# Patient Record
Sex: Male | Born: 1985
Health system: Southern US, Community
[De-identification: ages and names within clinical notes are randomized; demographics above are authoritative.]

## PROBLEM LIST (undated history)

## (undated) HISTORY — PX: NO PAST SURGERIES: SHX2092

---
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
## 2006-05-15 ENCOUNTER — Ambulatory Visit: Payer: Self-pay | Admitting: Pediatrics

## 2009-04-14 IMAGING — CR DG CHEST 2V
1 series · 2 of 2 positions shown · non-contrast
Comparison: NONE

CLINICAL DATA: Follow up right lung infiltrates. 

TWO VIEW CHEST X-RAY

[Series 1: view not recorded · 0.17mm/px · 2 of 2 slices shown]
[im 1/2]
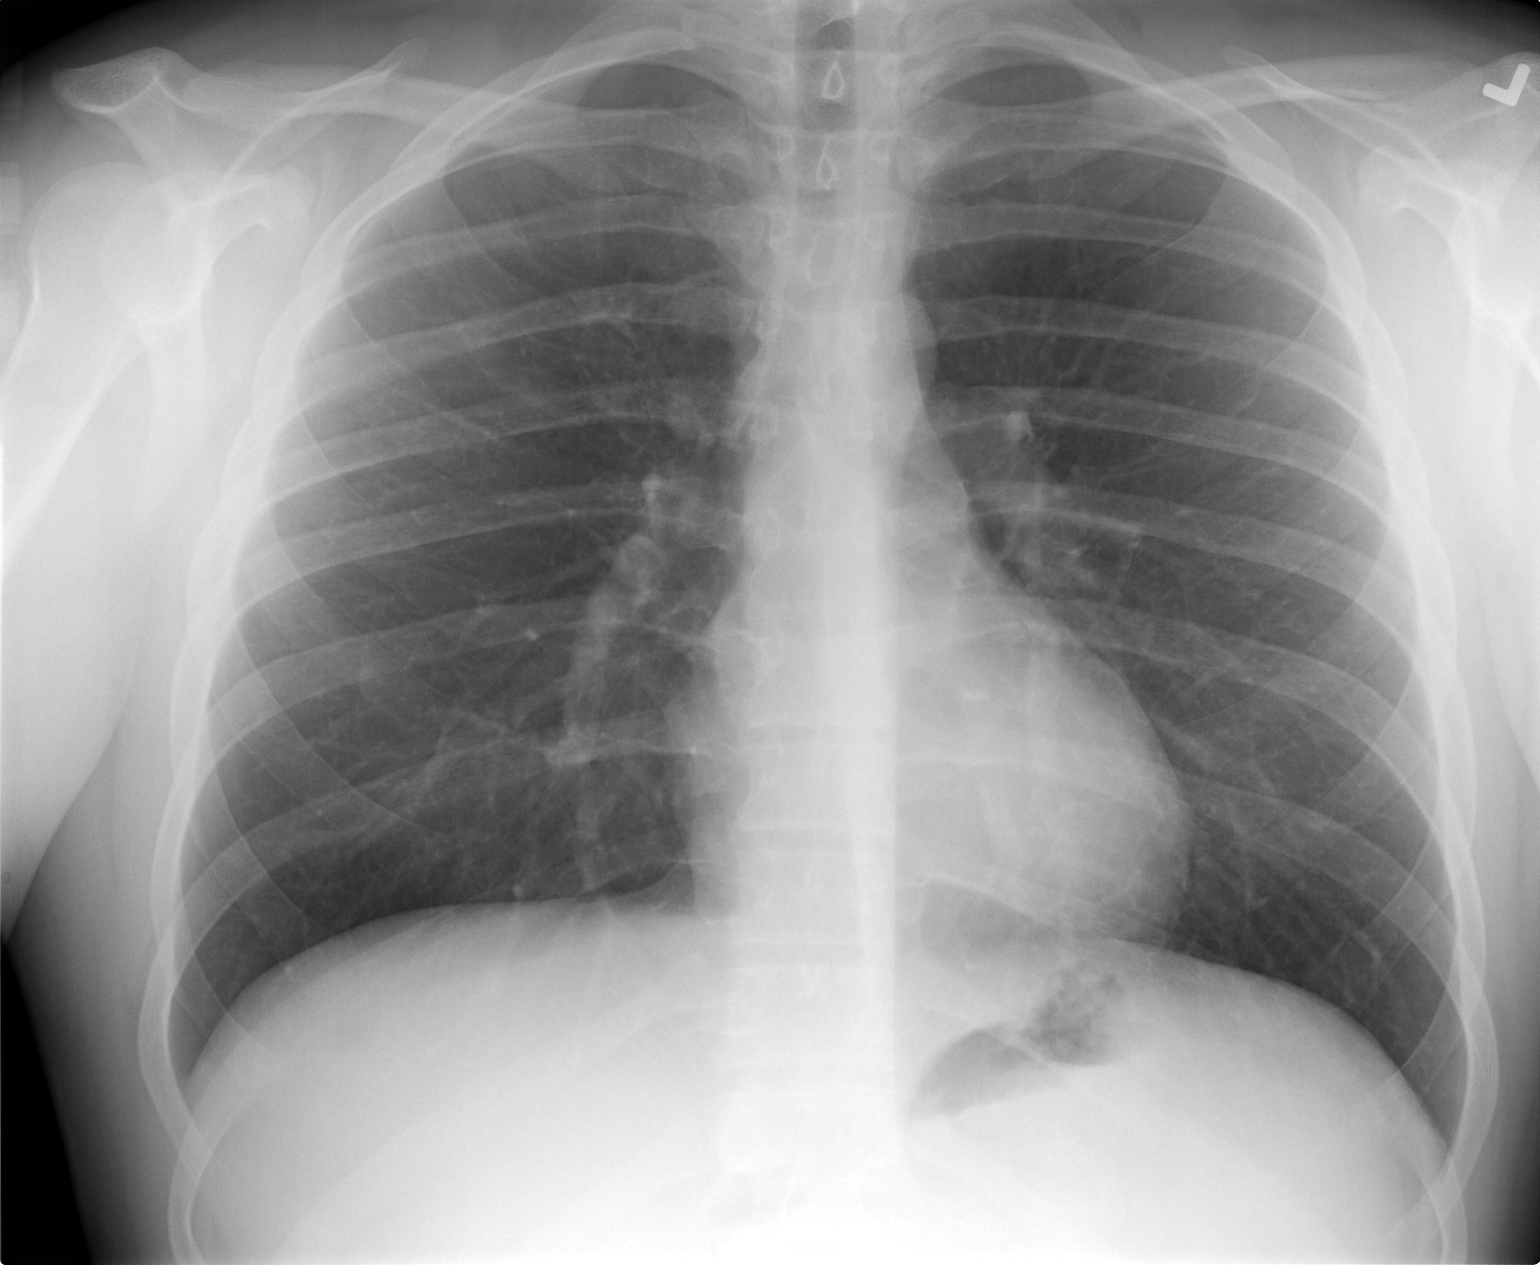
[im 2/2]
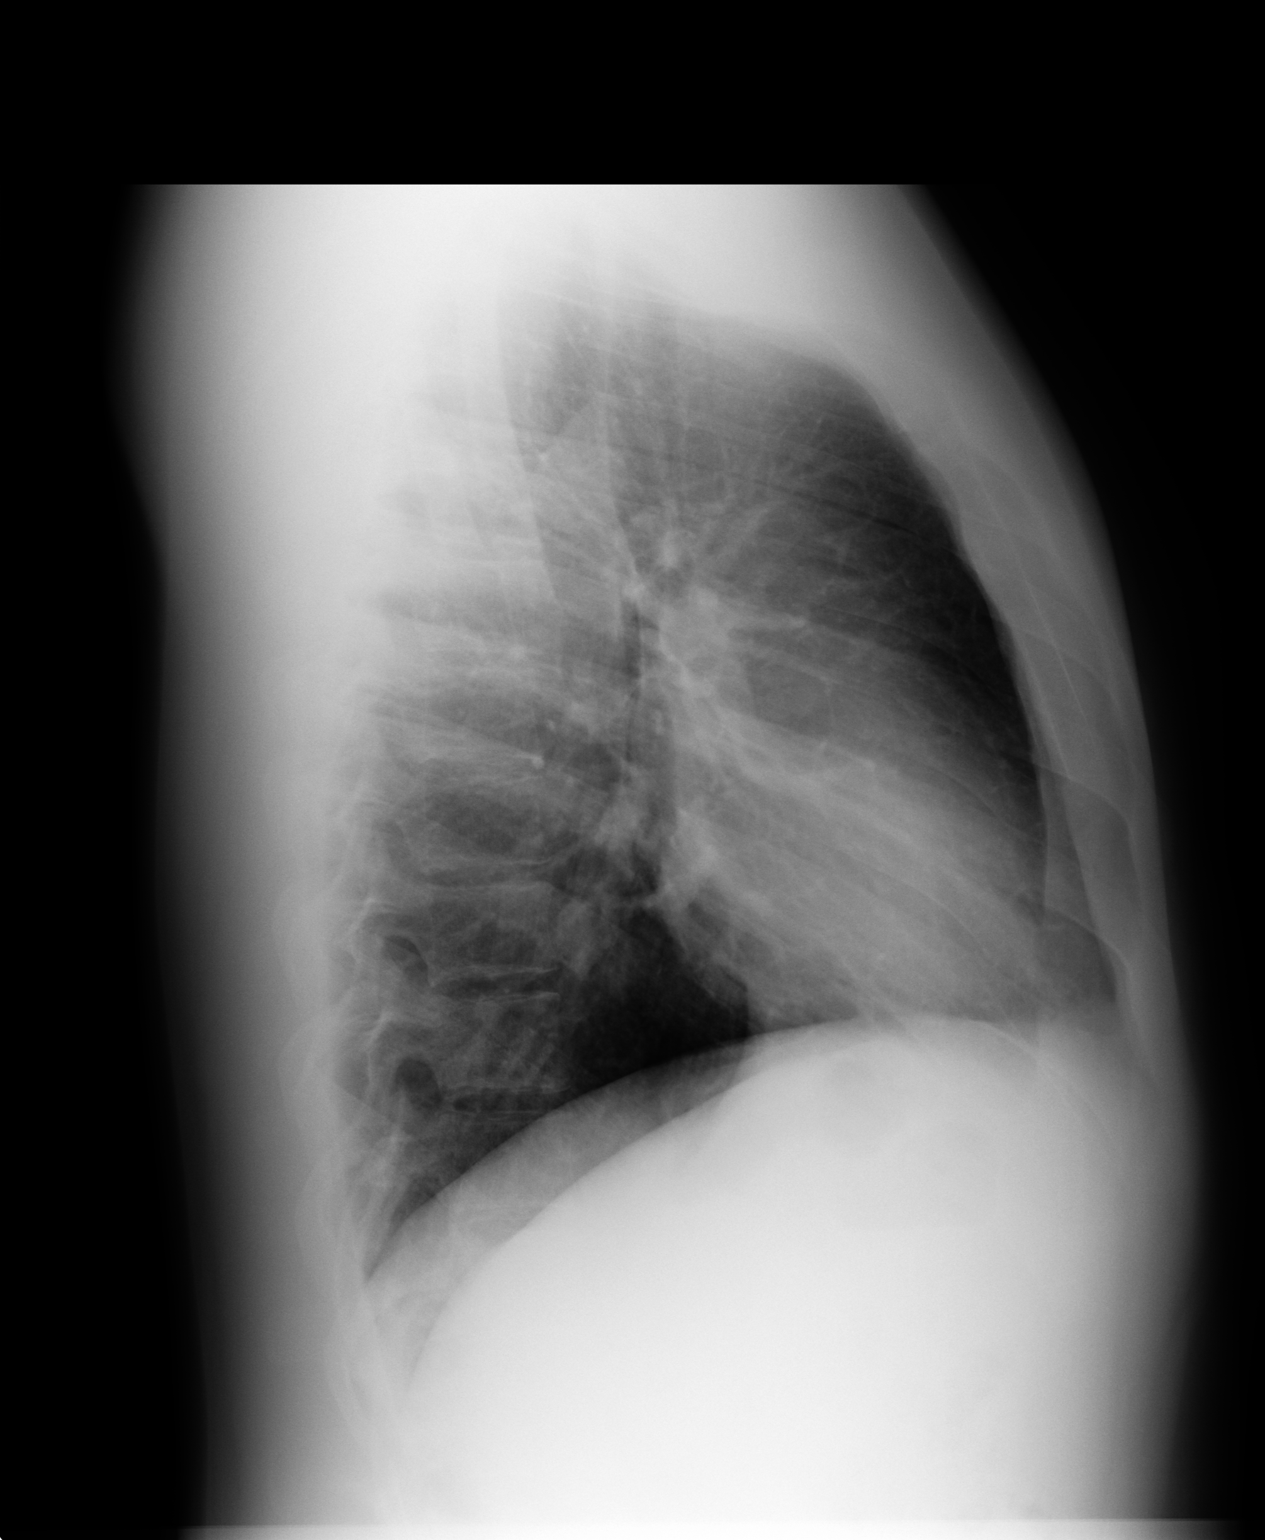

[2 of 2 positions shown; findings below may reference images not displayed]

FINDINGS: There is a prior chest image dated 01-08-07 for 
comparison.  The heart and mediastinum are within normal limits.  
Previously seen densities in the right upper lobe are no longer 
seen.  No evidence of consolidation or interstitial edema.  No 
pleural effusions or pneumothorax.
IMPRESSION: Chest radiographs are within normal limits. Jutiro Garubi

## 2019-09-01 DIAGNOSIS — S71132A Puncture wound without foreign body, left thigh, initial encounter: Secondary | ICD-10-CM | POA: Diagnosis not present

## 2019-09-01 DIAGNOSIS — S81032A Puncture wound without foreign body, left knee, initial encounter: Secondary | ICD-10-CM | POA: Diagnosis not present

## 2019-09-01 DIAGNOSIS — W228XXA Striking against or struck by other objects, initial encounter: Secondary | ICD-10-CM | POA: Diagnosis not present

## 2019-09-01 DIAGNOSIS — G8911 Acute pain due to trauma: Secondary | ICD-10-CM | POA: Diagnosis not present

## 2019-09-01 DIAGNOSIS — S71112A Laceration without foreign body, left thigh, initial encounter: Secondary | ICD-10-CM | POA: Diagnosis not present

## 2019-09-01 DIAGNOSIS — S79922A Unspecified injury of left thigh, initial encounter: Secondary | ICD-10-CM | POA: Diagnosis not present

## 2019-09-01 DIAGNOSIS — S76302A Unspecified injury of muscle, fascia and tendon of the posterior muscle group at thigh level, left thigh, initial encounter: Secondary | ICD-10-CM | POA: Diagnosis not present

## 2019-09-01 DIAGNOSIS — Z20828 Contact with and (suspected) exposure to other viral communicable diseases: Secondary | ICD-10-CM | POA: Diagnosis not present

## 2019-09-01 DIAGNOSIS — R600 Localized edema: Secondary | ICD-10-CM | POA: Diagnosis not present

## 2019-09-08 ENCOUNTER — Other Ambulatory Visit: Payer: Self-pay

## 2019-09-08 ENCOUNTER — Ambulatory Visit (INDEPENDENT_AMBULATORY_CARE_PROVIDER_SITE_OTHER): Payer: BLUE CROSS/BLUE SHIELD | Admitting: Orthopedic Surgery

## 2019-09-08 ENCOUNTER — Ambulatory Visit: Payer: Self-pay

## 2019-09-08 ENCOUNTER — Encounter: Payer: Self-pay | Admitting: Physician Assistant

## 2019-09-08 DIAGNOSIS — S71132A Puncture wound without foreign body, left thigh, initial encounter: Secondary | ICD-10-CM | POA: Diagnosis not present

## 2019-09-08 DIAGNOSIS — M79652 Pain in left thigh: Secondary | ICD-10-CM

## 2019-09-08 NOTE — Progress Notes (Signed)
Limited diagnostic ultrasound left medial thigh: He has fluid tracking through the subcutaneous fatty tissue and extending down through the fascia to the muscle.  There is hyperemia on power Doppler imaging.  This does not appear to be a localized hematoma but rather a diffuse swelling through the fatty tissue.  Cannot rule out necrotizing fasciitis.  Dr. Lajoyce Corners was present during the scan.

## 2019-09-08 NOTE — Progress Notes (Signed)
Office Visit Note   Patient: Timothy Boyd           Date of Birth: 02/11/1986           MRN: [id number] Visit Date: 09/08/2019              Requested by: Crist Infante, MD [address] [place],  [place] [address] PCP: Crist Infante, MD  Chief Complaint  Patient presents with  . Left Leg - Pain, Bleeding/Bruising      HPI: Patient is a 34 year old gentleman who was seen for initial evaluation for a penetrating trauma medial aspect of the left distal thigh.  Patient states a week ago he was playing golf in [place] he jumped over a hedge and landed on a metal rod.  He went to the emergency department he did undergo pulsatile lavage and the wound was closed he had a CT scan which showed no neurovascular injury.  Patient states he has pain in the thigh which is about 2/10 worse with weightbearing.  Patient is finishing up a course of doxycycline.  Assessment & Plan: Visit Diagnoses:  1. Left thigh pain   2. Penetrating thigh wound, left, initial encounter     Plan: In review of the ultrasound there is a large fluid collection that extends down to the fascia.  Patient also has the closed traumatic wound with muscle injury.  Have discussed this with the patient and his mother on the phone recommendation would be to proceed with debridement of the fluid collection anteriorly on the thigh this may require a separate incision over the area of fluctuance.  Recommended opening up the traumatic wound and extending the incision and excise any nonviable muscle proceed with further irrigation in a sterile environment with wound closure.  We will send tissue for cultures.  Risk and benefits were discussed recommendation to proceed with surgery urgently on Wednesday.  Discussed concern for the large fluid collection.  Follow-Up Instructions: Return in about 1 week (around 09/15/2019).   Ortho Exam  Patient is alert, oriented, no adenopathy, well-dressed, normal affect, normal respiratory effort.  Examination patient has a fluctuant area anterior over the thigh that does not seem to communicate with the traumatic wound.  On exam this appears about 3 cm in diameter however with review of the ultrasound this is a large fluid collection within the adipose tissue that extends down to fascia.  There is no cellulitis posteriorly in the popliteal fossa.  Patient has tenderness to palpation there is no crepitation to palpation no clinical signs of necrotizing fasciitis patient denies any fever or chills he has a good dorsalis pedis and posterior tibial pulse.  Patient has muscle weakness but most likely due to the trauma.  Patient has a large amount of ecchymosis and bruising involving the entire thigh circumferential.  Imaging: US Guided Needle Placement  Result Date: 09/08/2019 Please see Notes tab for imaging impression.  No images are attached to the encounter.  Labs: No results found for: HGBA1C, ESRSEDRATE, CRP, LABURIC, REPTSTATUS, GRAMSTAIN, CULT, LABORGA   No results found for: ALBUMIN, PREALBUMIN, LABURIC  No results found for: MG No results found for: VD25OH  No results found for: PREALBUMIN No flowsheet data found.   There is no height or weight on file to calculate BMI.  Orders:  Orders Placed This Encounter  Procedures  . US Guided Needle Placement   No orders of the defined types were placed in this encounter.    Procedures: No procedures performed  Clinical Data: No additional findings.  ROS:  All other systems negative, except as noted in the HPI. Review of Systems  Objective: Vital Signs: There were no vitals taken for this visit.  Specialty Comments:  No specialty comments available.  PMFS History: There are no problems to display for this patient.  History reviewed. No pertinent past medical history.  History reviewed. No pertinent family history.  History reviewed. No pertinent surgical history. Social History   Occupational History  .  Not on file  Tobacco Use  . Smoking status: Never Smoker  . Smokeless tobacco: Never Used  Substance and Sexual Activity  . Alcohol use: Not on file  . Drug use: Not on file  . Sexual activity: Not on file

## 2019-09-09 ENCOUNTER — Other Ambulatory Visit (HOSPITAL_COMMUNITY)
Admission: RE | Admit: 2019-09-09 | Discharge: 2019-09-09 | Disposition: A | Discharge status: Home / Self Care | Payer: BLUE CROSS/BLUE SHIELD | Source: Ambulatory Visit | Attending: Orthopedic Surgery | Admitting: Orthopedic Surgery

## 2019-09-09 ENCOUNTER — Encounter (HOSPITAL_COMMUNITY): Payer: Self-pay | Admitting: Orthopedic Surgery

## 2019-09-09 ENCOUNTER — Other Ambulatory Visit: Payer: Self-pay | Admitting: Physician Assistant

## 2019-09-09 DIAGNOSIS — S76322A Laceration of muscle, fascia and tendon of the posterior muscle group at thigh level, left thigh, initial encounter: Secondary | ICD-10-CM | POA: Diagnosis not present

## 2019-09-09 DIAGNOSIS — Y9389 Activity, other specified: Secondary | ICD-10-CM | POA: Diagnosis not present

## 2019-09-09 DIAGNOSIS — S7012XA Contusion of left thigh, initial encounter: Secondary | ICD-10-CM | POA: Diagnosis not present

## 2019-09-09 DIAGNOSIS — S76822A Laceration of other specified muscles, fascia and tendons at thigh level, left thigh, initial encounter: Secondary | ICD-10-CM | POA: Diagnosis not present

## 2019-09-09 DIAGNOSIS — Z01812 Encounter for preprocedural laboratory examination: Secondary | ICD-10-CM | POA: Diagnosis not present

## 2019-09-09 DIAGNOSIS — Z20822 Contact with and (suspected) exposure to covid-19: Secondary | ICD-10-CM | POA: Diagnosis not present

## 2019-09-09 DIAGNOSIS — W268XXA Contact with other sharp object(s), not elsewhere classified, initial encounter: Secondary | ICD-10-CM | POA: Diagnosis not present

## 2019-09-09 LAB — SARS CORONAVIRUS 2 (TAT 6-24 HRS): SARS Coronavirus 2: NEGATIVE

## 2019-09-09 NOTE — Progress Notes (Addendum)
Mr Castrillon denies chest pain or shortness of breath. Mr Hardgrove denies symptoms of COvid, he was testwd today and is in quarantine with his family. I instructed patient tto not eat after midnight, but may have clear liquids until 1230. We went over what clear liquids consist of.  Mr Kluver states he will probably drink black coffee and electrolyte drink. I instructed Mr Critzer to not take anymore Advil. Mr. Hoose mother came to pick up Per Surgery Ensure and Surgical Scruib.

## 2019-09-10 ENCOUNTER — Ambulatory Visit (HOSPITAL_COMMUNITY)
Admission: RE | Admit: 2019-09-10 | Discharge: 2019-09-10 | Disposition: A | Discharge status: Home / Self Care | Payer: BLUE CROSS/BLUE SHIELD | Attending: Orthopedic Surgery | Admitting: Orthopedic Surgery

## 2019-09-10 ENCOUNTER — Encounter (HOSPITAL_COMMUNITY)
Admission: RE | Disposition: A | Discharge status: Home / Self Care | Payer: Self-pay | Source: Home / Self Care | Attending: Orthopedic Surgery

## 2019-09-10 ENCOUNTER — Encounter (HOSPITAL_COMMUNITY): Payer: Self-pay | Admitting: Orthopedic Surgery

## 2019-09-10 ENCOUNTER — Ambulatory Visit (HOSPITAL_COMMUNITY): Payer: BLUE CROSS/BLUE SHIELD | Admitting: Certified Registered Nurse Anesthetist

## 2019-09-10 ENCOUNTER — Other Ambulatory Visit: Payer: Self-pay | Admitting: Family

## 2019-09-10 ENCOUNTER — Other Ambulatory Visit: Payer: Self-pay

## 2019-09-10 DIAGNOSIS — S76822A Laceration of other specified muscles, fascia and tendons at thigh level, left thigh, initial encounter: Secondary | ICD-10-CM | POA: Diagnosis not present

## 2019-09-10 DIAGNOSIS — Y9389 Activity, other specified: Secondary | ICD-10-CM | POA: Insufficient documentation

## 2019-09-10 DIAGNOSIS — S76312A Strain of muscle, fascia and tendon of the posterior muscle group at thigh level, left thigh, initial encounter: Secondary | ICD-10-CM

## 2019-09-10 DIAGNOSIS — L02416 Cutaneous abscess of left lower limb: Secondary | ICD-10-CM

## 2019-09-10 DIAGNOSIS — S7012XA Contusion of left thigh, initial encounter: Secondary | ICD-10-CM | POA: Insufficient documentation

## 2019-09-10 DIAGNOSIS — S76322A Laceration of muscle, fascia and tendon of the posterior muscle group at thigh level, left thigh, initial encounter: Secondary | ICD-10-CM | POA: Diagnosis not present

## 2019-09-10 DIAGNOSIS — W268XXA Contact with other sharp object(s), not elsewhere classified, initial encounter: Secondary | ICD-10-CM | POA: Diagnosis not present

## 2019-09-10 HISTORY — PX: I & D EXTREMITY: SHX5045

## 2019-09-10 SURGERY — IRRIGATION AND DEBRIDEMENT EXTREMITY
Anesthesia: General | Site: Leg Upper | Laterality: Left

## 2019-09-10 MED ORDER — ONDANSETRON HCL 4 MG/2ML IJ SOLN
INTRAMUSCULAR | Status: DC | PRN
  Administered 2019-09-10: 4 mg via INTRAVENOUS

## 2019-09-10 MED ORDER — FENTANYL CITRATE (PF) 100 MCG/2ML IJ SOLN
INTRAMUSCULAR | Status: DC | PRN
  Administered 2019-09-10 (×5): 50 ug via INTRAVENOUS

## 2019-09-10 MED ORDER — ONDANSETRON HCL 4 MG/2ML IJ SOLN
INTRAMUSCULAR | Status: AC
  Filled 2019-09-10: qty 6

## 2019-09-10 MED ORDER — MIDAZOLAM HCL 5 MG/5ML IJ SOLN
INTRAMUSCULAR | Status: DC | PRN
  Administered 2019-09-10: 2 mg via INTRAVENOUS

## 2019-09-10 MED ORDER — LIDOCAINE 2% (20 MG/ML) 5 ML SYRINGE
INTRAMUSCULAR | Status: DC | PRN
  Administered 2019-09-10: 50 mg via INTRAVENOUS

## 2019-09-10 MED ORDER — ACETAMINOPHEN 10 MG/ML IV SOLN: 1000.0000 mg | Freq: Once | INTRAVENOUS | Status: DC | PRN

## 2019-09-10 MED ORDER — HYDROMORPHONE HCL 1 MG/ML IJ SOLN
0.2500 mg | INTRAMUSCULAR | Status: DC | PRN
  Administered 2019-09-10: 15:00:00 0.25 mg via INTRAVENOUS

## 2019-09-10 MED ORDER — LACTATED RINGERS IV SOLN: INTRAVENOUS | Status: DC

## 2019-09-10 MED ORDER — DEXAMETHASONE SODIUM PHOSPHATE 10 MG/ML IJ SOLN
INTRAMUSCULAR | Status: AC
  Filled 2019-09-10: qty 2

## 2019-09-10 MED ORDER — PROMETHAZINE HCL 25 MG/ML IJ SOLN
INTRAMUSCULAR | Status: AC
  Filled 2019-09-10: qty 1

## 2019-09-10 MED ORDER — CEFAZOLIN SODIUM-DEXTROSE 2-4 GM/100ML-% IV SOLN
2.0000 g | INTRAVENOUS | Status: AC
  Administered 2019-09-10: 2 g via INTRAVENOUS
  Filled 2019-09-10: qty 100

## 2019-09-10 MED ORDER — PROPOFOL 10 MG/ML IV BOLUS
INTRAVENOUS | Status: AC
  Filled 2019-09-10: qty 40

## 2019-09-10 MED ORDER — FENTANYL CITRATE (PF) 250 MCG/5ML IJ SOLN
INTRAMUSCULAR | Status: AC
  Filled 2019-09-10: qty 5

## 2019-09-10 MED ORDER — ONDANSETRON HCL 4 MG PO TABS: 4.0000 mg | ORAL_TABLET | Freq: Three times a day (TID) | ORAL | 0 refills | Status: AC | PRN

## 2019-09-10 MED ORDER — 0.9 % SODIUM CHLORIDE (POUR BTL) OPTIME
TOPICAL | Status: DC | PRN
  Administered 2019-09-10: 1000 mL

## 2019-09-10 MED ORDER — CHLORHEXIDINE GLUCONATE 4 % EX LIQD: 60.0000 mL | Freq: Once | CUTANEOUS | Status: DC

## 2019-09-10 MED ORDER — PHENYLEPHRINE 40 MCG/ML (10ML) SYRINGE FOR IV PUSH (FOR BLOOD PRESSURE SUPPORT)
PREFILLED_SYRINGE | INTRAVENOUS | Status: AC
  Filled 2019-09-10: qty 10

## 2019-09-10 MED ORDER — KETOROLAC TROMETHAMINE 30 MG/ML IJ SOLN
INTRAMUSCULAR | Status: DC | PRN
  Administered 2019-09-10: 30 mg via INTRAVENOUS

## 2019-09-10 MED ORDER — DEXAMETHASONE SODIUM PHOSPHATE 4 MG/ML IJ SOLN
INTRAMUSCULAR | Status: DC | PRN
  Administered 2019-09-10: 10 mg via INTRAVENOUS

## 2019-09-10 MED ORDER — HYDROMORPHONE HCL 1 MG/ML IJ SOLN
INTRAMUSCULAR | Status: AC
  Filled 2019-09-10: qty 1

## 2019-09-10 MED ORDER — ACETAMINOPHEN 10 MG/ML IV SOLN
INTRAVENOUS | Status: DC | PRN
  Administered 2019-09-10: 1000 mg via INTRAVENOUS

## 2019-09-10 MED ORDER — MIDAZOLAM HCL 2 MG/2ML IJ SOLN
INTRAMUSCULAR | Status: AC
  Filled 2019-09-10: qty 2

## 2019-09-10 MED ORDER — PROPOFOL 10 MG/ML IV BOLUS
INTRAVENOUS | Status: DC | PRN
  Administered 2019-09-10 (×2): 50 mg via INTRAVENOUS
  Administered 2019-09-10: 300 mg via INTRAVENOUS

## 2019-09-10 MED ORDER — ACETAMINOPHEN 160 MG/5ML PO SOLN: 325.0000 mg | Freq: Once | ORAL | Status: DC | PRN

## 2019-09-10 MED ORDER — SULFAMETHOXAZOLE-TRIMETHOPRIM 800-160 MG PO TABS: 1.0000 | ORAL_TABLET | Freq: Two times a day (BID) | ORAL | 0 refills | Status: AC

## 2019-09-10 MED ORDER — MEPERIDINE HCL 25 MG/ML IJ SOLN: 6.2500 mg | INTRAMUSCULAR | Status: DC | PRN

## 2019-09-10 MED ORDER — LIDOCAINE 2% (20 MG/ML) 5 ML SYRINGE
INTRAMUSCULAR | Status: AC
  Filled 2019-09-10: qty 5

## 2019-09-10 MED ORDER — PROMETHAZINE HCL 25 MG/ML IJ SOLN: 6.2500 mg | INTRAMUSCULAR | Status: DC | PRN

## 2019-09-10 MED ORDER — ACETAMINOPHEN 325 MG PO TABS: 325.0000 mg | ORAL_TABLET | Freq: Once | ORAL | Status: DC | PRN

## 2019-09-10 MED ORDER — ACETAMINOPHEN 10 MG/ML IV SOLN
INTRAVENOUS | Status: AC
  Filled 2019-09-10: qty 100

## 2019-09-10 MED ORDER — SODIUM CHLORIDE 0.9 % IR SOLN
Status: DC | PRN
  Administered 2019-09-10: 3000 mL

## 2019-09-10 MED ORDER — OXYCODONE-ACETAMINOPHEN 5-325 MG PO TABS: 1.0000 | ORAL_TABLET | ORAL | 0 refills | Status: AC | PRN

## 2019-09-10 SURGICAL SUPPLY — 38 items
BLADE SURG 21 STRL SS (BLADE) ×3 IMPLANT
BNDG COHESIVE 6X5 TAN STRL LF (GAUZE/BANDAGES/DRESSINGS) ×2 IMPLANT
BNDG GAUZE ELAST 4 BULKY (GAUZE/BANDAGES/DRESSINGS) ×6 IMPLANT
COVER SURGICAL LIGHT HANDLE (MISCELLANEOUS) ×6 IMPLANT
COVER WAND RF STERILE (DRAPES) ×3 IMPLANT
DRAPE U-SHAPE 47X51 STRL (DRAPES) ×3 IMPLANT
DRESSING PREVENA PLUS CUSTOM (GAUZE/BANDAGES/DRESSINGS) IMPLANT
DRSG ADAPTIC 3X8 NADH LF (GAUZE/BANDAGES/DRESSINGS) ×3 IMPLANT
DRSG PREVENA PLUS CUSTOM (GAUZE/BANDAGES/DRESSINGS) ×3
DURAPREP 26ML APPLICATOR (WOUND CARE) ×3 IMPLANT
ELECT REM PT RETURN 9FT ADLT (ELECTROSURGICAL)
ELECTRODE REM PT RTRN 9FT ADLT (ELECTROSURGICAL) IMPLANT
GAUZE SPONGE 4X4 12PLY STRL (GAUZE/BANDAGES/DRESSINGS) ×3 IMPLANT
GLOVE BIOGEL PI IND STRL 9 (GLOVE) ×1 IMPLANT
GLOVE BIOGEL PI INDICATOR 9 (GLOVE) ×2
GLOVE SURG ORTHO 9.0 STRL STRW (GLOVE) ×3 IMPLANT
GOWN STRL REUS W/ TWL XL LVL3 (GOWN DISPOSABLE) ×2 IMPLANT
GOWN STRL REUS W/TWL XL LVL3 (GOWN DISPOSABLE) ×6
HANDPIECE INTERPULSE COAX TIP (DISPOSABLE)
IMMOBILIZER KNEE 20 (SOFTGOODS) ×3
IMMOBILIZER KNEE 20 THIGH 36 (SOFTGOODS) IMPLANT
KIT BASIN OR (CUSTOM PROCEDURE TRAY) ×3 IMPLANT
KIT DRSG PREVENA PLUS 7DAY 125 (MISCELLANEOUS) ×2 IMPLANT
KIT TURNOVER KIT B (KITS) ×3 IMPLANT
MANIFOLD NEPTUNE II (INSTRUMENTS) ×3 IMPLANT
NS IRRIG 1000ML POUR BTL (IV SOLUTION) ×3 IMPLANT
PACK ORTHO EXTREMITY (CUSTOM PROCEDURE TRAY) ×3 IMPLANT
PAD ARMBOARD 7.5X6 YLW CONV (MISCELLANEOUS) ×6 IMPLANT
SET HNDPC FAN SPRY TIP SCT (DISPOSABLE) IMPLANT
STOCKINETTE IMPERVIOUS 9X36 MD (GAUZE/BANDAGES/DRESSINGS) ×2 IMPLANT
SUT VIC AB 1 CT1 27 (SUTURE) ×3
SUT VIC AB 1 CT1 27XBRD ANBCTR (SUTURE) IMPLANT
SWAB COLLECTION DEVICE MRSA (MISCELLANEOUS) ×3 IMPLANT
SWAB CULTURE ESWAB REG 1ML (MISCELLANEOUS) IMPLANT
TOWEL GREEN STERILE (TOWEL DISPOSABLE) ×3 IMPLANT
TUBE CONNECTING 12'X1/4 (SUCTIONS) ×1
TUBE CONNECTING 12X1/4 (SUCTIONS) ×2 IMPLANT
YANKAUER SUCT BULB TIP NO VENT (SUCTIONS) ×3 IMPLANT

## 2019-09-10 NOTE — Anesthesia Postprocedure Evaluation (Signed)
Anesthesia Post Note  Patient: Timothy Boyd  Procedure(s) Performed: LEFT THIGH DEBRIDEMENT (Left Leg Upper)     Patient location during evaluation: PACU Anesthesia Type: General Level of consciousness: awake and alert Pain management: pain level controlled Vital Signs Assessment: post-procedure vital signs reviewed and stable Respiratory status: spontaneous breathing, nonlabored ventilation, respiratory function stable and patient connected to nasal cannula oxygen Cardiovascular status: blood pressure returned to baseline and stable Postop Assessment: no apparent nausea or vomiting Anesthetic complications: no    Last Vitals:  Vitals:   09/10/19 1452 09/10/19 1507  BP: 138/73 138/80  Pulse: 97 80  Resp: 18 16  Temp: 36.7 C   SpO2: 99% 98%    Last Pain:  Vitals:   09/10/19 1452  TempSrc:   PainSc: 0-No pain                 Shelton Silvas

## 2019-09-10 NOTE — Progress Notes (Signed)
Orthopedic Tech Progress Note Patient Details:  Timothy Boyd April 23, 1986 [id number] PACU RN called requesting a pair of crutches for patient Ortho Devices Type of Ortho Device: Crutches Ortho Device/Splint Interventions: Adjustment, Application, Ordered   Post Interventions Patient Tolerated: Well Instructions Provided: Care of device, Adjustment of device   Donald Pore 09/10/2019, 3:27 PM

## 2019-09-10 NOTE — Op Note (Signed)
09/10/2019  2:59 PM  PATIENT:  Timothy Boyd    PRE-OPERATIVE DIAGNOSIS:  Traumatic Wound Left Thigh  POST-OPERATIVE DIAGNOSIS: Traumatic wound left thigh with complete laceration of the semimembranosus hand spring tendon.  PROCEDURE:  LEFT THIGH DEBRIDEMENT tissue sent for cultures for aerobic anaerobic fungal and TB. Debridement and repair semimembranosus tendon left thigh. Local tissue rearrangement for wound closure 9 x 19 cm. Application of a 20 cm Prevena wound VAC.  SURGEON:  Nadara Mustard, MD  PHYSICIAN ASSISTANT:None ANESTHESIA:   General  PREOPERATIVE INDICATIONS:  Timothy Boyd is a  34 y.o. male with a diagnosis of Traumatic Wound Left Thigh who failed conservative measures and elected for surgical management.    The risks benefits and alternatives were discussed with the patient preoperatively including but not limited to the risks of infection, bleeding, nerve injury, cardiopulmonary complications, the need for revision surgery, among others, and the patient was willing to proceed.  OPERATIVE IMPLANTS: Praveena 20 cm wound VAC  @ENCIMAGES @  OPERATIVE FINDINGS: Large hematoma with a large amount of nonviable muscle with complete laceration of the semimembranosus tendon.  OPERATIVE PROCEDURE: Patient was brought the operating room and underwent a general anesthetic.  After adequate levels anesthesia were obtained patient's left lower extremity was prepped using DuraPrep draped into a sterile field a timeout was called.  Patient had a small puncture wound in the popliteal fossa this was extended proximally and nonviable muscle from the hamstring muscle was encountered.  The incision was extended more proximally for a 19 cm incision and the nonviable traumatic wound was ellipsed out which left a wound that was 9 x 19 cm.  This wound extended all the way up to the anterior thigh where the patient had what appeared to be a slight blister this in fact was where the metal rod went  through the popliteal fossa and almost exited the skin in the thigh.  After debridement of the nonviable muscle with sharp excision with scissors and a rondure patient had visible a complete laceration of the semimembranosus hamstring muscle with approximately 4 cm of retraction.  The muscle edges were debrided back to healthy viable muscle that had good color good contractility good consistency.  With the knee flexed the tendon was repaired using #1 Vicryl.  The wound was irrigated with pulsatile lavage.  Local tissue rearrangement was used to close the wound 9 x 19 cm.  This was then covered with a Praveena 20 cm incisional wound VAC this was covered with Covan this had a good suction patient was placed in a knee immobilizer bed at 70 degrees.  Orders are written for a Bledsoe brace in the recovery room.  Patient received Toradol at the end of surgery.  Extubated taken the PACU in stable condition.   DISCHARGE PLANNING:  Antibiotic duration: Patient will continue with Bactrim DS postoperatively  Weightbearing: Nonweightbearing on the left  Pain medication: Prescription for Percocet  Dressing care/ Wound VAC: Incisional wound VAC  Ambulatory devices: Crutches  Discharge to: Home.  Follow-up: In the office 1 week post operative.

## 2019-09-10 NOTE — Anesthesia Procedure Notes (Signed)
Procedure Name: LMA Insertion Date/Time: 09/10/2019 1:36 PM Performed by: Jed Limerick, CRNA Pre-anesthesia Checklist: Patient identified, Emergency Drugs available, Suction available and Patient being monitored Patient Re-evaluated:Patient Re-evaluated prior to induction Oxygen Delivery Method: Circle System Utilized Preoxygenation: Pre-oxygenation with 100% oxygen Induction Type: IV induction LMA: LMA inserted LMA Size: 5.0 Number of attempts: 1 Placement Confirmation: positive ETCO2 Tube secured with: Tape Dental Injury: Teeth and Oropharynx as per pre-operative assessment

## 2019-09-10 NOTE — Progress Notes (Signed)
Pt has recent lab work posted in [employer].   Viviano Simas, RN

## 2019-09-10 NOTE — H&P (Signed)
Timothy Boyd is an 34 y.o. male.   Chief Complaint: Left Leg Pain HPI:  Patient is a 34 year old gentleman who was seen for initial evaluation for a penetrating trauma medial aspect of the left distal thigh.  Patient states a week ago he was playing golf in [place] he jumped over a hedge and landed on a metal rod.  He went to the emergency department he did undergo pulsatile lavage and the wound was closed he had a CT scan which showed no neurovascular injury.  Patient states he has pain in the thigh which is about 2/10 worse with weightbearing.  Patient is finishing up a course of doxycycline. No past medical history on file.  Past Surgical History:  Procedure Laterality Date  . NO PAST SURGERIES      No family history on file. Social History:  reports that he has never smoked. He has never used smokeless tobacco. He reports current alcohol use of about 10.0 standard drinks of alcohol per week. He reports previous drug use.  Allergies: No Known Allergies  No medications prior to admission.    Results for orders placed or performed during the hospital encounter of 09/09/19 (from the past 48 hour(s))  SARS CORONAVIRUS 2 (TAT 6-24 HRS) Nasopharyngeal Nasopharyngeal Swab     Status: None   Collection Time: 09/09/19  2:42 PM   Specimen: Nasopharyngeal Swab  Result Value Ref Range   SARS Coronavirus 2 NEGATIVE NEGATIVE    Comment: (NOTE) SARS-CoV-2 target nucleic acids are NOT DETECTED. The SARS-CoV-2 RNA is generally detectable in upper and lower respiratory specimens during the acute phase of infection. Negative results do not preclude SARS-CoV-2 infection, do not rule out co-infections with other pathogens, and should not be used as the sole basis for treatment or other patient management decisions. Negative results must be combined with clinical observations, patient history, and epidemiological information. The expected result is Negative. Fact Sheet for  Patients: HairSlick.no Fact Sheet for Healthcare Providers: quierodirigir.com This test is not yet approved or cleared by the [place] FDA and  has been authorized for detection and/or diagnosis of SARS-CoV-2 by FDA under an Emergency Use Authorization (EUA). This EUA will remain  in effect (meaning this test can be used) for the duration of the COVID-19 declaration under Section 56 4(b)(1) of the Act, 21 U.S.C. section 360bbb-3(b)(1), unless the authorization is terminated or revoked sooner. Performed at [facility], [address], [place], [place] [address]    US Guided Needle Placement  Result Date: 09/08/2019 Please see Notes tab for imaging impression.   Review of Systems  All other systems reviewed and are negative.   Height 6' (1.829 m), weight 111.1 kg. Physical Exam  Patient is alert, oriented, no adenopathy, well-dressed, normal affect, normal respiratory effort. Examination patient has a fluctuant area anterior over the thigh that does not seem to communicate with the traumatic wound.  On exam this appears about 3 cm in diameter however with review of the ultrasound this is a large fluid collection within the adipose tissue that extends down to fascia.  There is no cellulitis posteriorly in the popliteal fossa.  Patient has tenderness to palpation there is no crepitation to palpation no clinical signs of necrotizing fasciitis patient denies any fever or chills he has a good dorsalis pedis and posterior tibial pulse.  Patient has muscle weakness but most likely due to the trauma.  Patient has a large amount of ecchymosis and bruising involving the entire thigh  circumferential. Assessment/Plan 1. Left thigh pain   2. Penetrating thigh wound, left, initial encounter     Plan: In review of the ultrasound there is a large fluid collection that extends down to the fascia.  Patient also has the closed traumatic  wound with muscle injury.  Have discussed this with the patient and his mother on the phone recommendation would be to proceed with debridement of the fluid collection anteriorly on the thigh this may require a separate incision over the area of fluctuance.  Recommended opening up the traumatic wound and extending the incision and excise any nonviable muscle proceed with further irrigation in a sterile environment with wound closure.  We will send tissue for cultures.  Risk and benefits were discussed recommendation to proceed with surgery urgently on Wednesday.  Discussed concern for the large fluid collection.   Bevely Palmer Xaidyn Kepner, PA 09/10/2019, 6:38 AM

## 2019-09-10 NOTE — Transfer of Care (Signed)
Immediate Anesthesia Transfer of Care Note  Patient: Timothy Boyd  Procedure(s) Performed: LEFT THIGH DEBRIDEMENT (Left Leg Upper)  Patient Location: PACU  Anesthesia Type:General  Level of Consciousness: awake, alert  and oriented  Airway & Oxygen Therapy: Patient Spontanous Breathing and Patient connected to face mask oxygen  Post-op Assessment: Report given to RN and Post -op Vital signs reviewed and stable  Post vital signs: Reviewed and stable  Last Vitals:  Vitals Value Taken Time  BP 138/73 09/10/19 1452  Temp    Pulse 100 09/10/19 1453  Resp 18 09/10/19 1453  SpO2 99 % 09/10/19 1453  Vitals shown include unvalidated device data.  Last Pain:  Vitals:   09/10/19 1221  TempSrc:   PainSc: 2       Patients Stated Pain Goal: 2 (09/10/19 1221)  Complications: No apparent anesthesia complications

## 2019-09-10 NOTE — Anesthesia Preprocedure Evaluation (Addendum)
Anesthesia Evaluation  Patient identified by MRN, date of birth, ID band Patient awake    Reviewed: Allergy & Precautions, NPO status , Patient's Chart, lab work & pertinent test results  Airway Mallampati: I  TM Distance: >3 FB Neck ROM: Full    Dental  (+) Teeth Intact, Dental Advisory Given   Pulmonary neg pulmonary ROS,    breath sounds clear to auscultation       Cardiovascular negative cardio ROS   Rhythm:Regular Rate:Normal     Neuro/Psych negative neurological ROS  negative psych ROS   GI/Hepatic negative GI ROS, Neg liver ROS,   Endo/Other  negative endocrine ROS  Renal/GU negative Renal ROS     Musculoskeletal negative musculoskeletal ROS (+)   Abdominal Normal abdominal exam  (+)   Peds  Hematology   Anesthesia Other Findings   Reproductive/Obstetrics                            Anesthesia Physical Anesthesia Plan  ASA: II  Anesthesia Plan: General   Post-op Pain Management:    Induction: Intravenous  PONV Risk Score and Plan: 3 and Ondansetron, Treatment may vary due to age or medical condition and Midazolam  Airway Management Planned: LMA  Additional Equipment: None  Intra-op Plan:   Post-operative Plan: Extubation in OR  Informed Consent: I have reviewed the patients History and Physical, chart, labs and discussed the procedure including the risks, benefits and alternatives for the proposed anesthesia with the patient or authorized representative who has indicated his/her understanding and acceptance.       Plan Discussed with: CRNA  Anesthesia Plan Comments:        Anesthesia Quick Evaluation

## 2019-09-15 LAB — AEROBIC/ANAEROBIC CULTURE W GRAM STAIN (SURGICAL/DEEP WOUND): Culture: NO GROWTH

## 2019-09-16 ENCOUNTER — Telehealth: Payer: Self-pay | Admitting: Orthopedic Surgery

## 2019-09-16 NOTE — Telephone Encounter (Signed)
Received call from Hayden (PT) with [facility] in [place] needing order for (PT)  Faxed to her and any notes that she can get. The fax# is [phone]  The phone # is [phone]

## 2019-09-17 ENCOUNTER — Encounter: Payer: Self-pay | Admitting: Physician Assistant

## 2019-09-17 ENCOUNTER — Ambulatory Visit (INDEPENDENT_AMBULATORY_CARE_PROVIDER_SITE_OTHER): Payer: BC Managed Care – PPO | Admitting: Physician Assistant

## 2019-09-17 ENCOUNTER — Other Ambulatory Visit: Payer: Self-pay

## 2019-09-17 VITALS — Ht 72.0 in | Wt 245.0 lb

## 2019-09-17 DIAGNOSIS — S76312A Strain of muscle, fascia and tendon of the posterior muscle group at thigh level, left thigh, initial encounter: Secondary | ICD-10-CM

## 2019-09-17 MED ORDER — METHOCARBAMOL 500 MG PO TABS: 500.0000 mg | ORAL_TABLET | Freq: Four times a day (QID) | ORAL | 0 refills | Status: AC | PRN

## 2019-09-17 NOTE — Telephone Encounter (Signed)
Patient appt is today 09/17/2019 at 2 pm. Patient will be coming in for her 1st week post op appointment. Will get order after visit and any notes faxed to physical therapy at listed number as soon as it becomes available. Will hold message.

## 2019-09-17 NOTE — Progress Notes (Signed)
Office Visit Note   Patient: Timothy Boyd           Date of Birth: 06/12/1986           MRN: [id number] Visit Date: 09/17/2019              Requested by: Crist Infante, MD [address] [place],  [place] [address] PCP: Crist Infante, MD  Chief Complaint  Patient presents with  . Left Leg - Routine Post Op    09/10/19 Left Thigh Deb      HPI: This is a pleasant gentleman who is now 1 week status post left thigh debridement and repair of traumatic hamstring tear.  He has had a wound VAC on in his foot and ankle brace locked at 70 degrees of flexion.  He is overall doing well.  He is having some muscle spasms  Assessment & Plan: Visit Diagnoses: No diagnosis found.  Plan: He is going to return to his home in [place].  He understands that stitches will need to remain in place for approximately another week.  He may shower and get this area wet.  He will be nonweightbearing for approximately a month but should continue to open up the brace with a goal of being at extension at about 1 month from now.  He may then begin physical therapy for strengthening.  Apply cocoa butter to the scar and consider massage therapy to prevent scar tissue buildup  Follow-Up Instructions: No follow-ups on file.   Ortho Exam  Patient is alert, oriented, no adenopathy, well-dressed, normal affect, normal respiratory effort. Focused examination demonstrates healing surgical incision.  Minimal serous drainage.  Healthy wound edges which are well apposed surgical sutures are in place  Imaging: No results found. No images are attached to the encounter.  Labs: Lab Results  Component Value Date   REPTSTATUS 09/15/2019 FINAL 09/10/2019   GRAMSTAIN  09/10/2019    FEW WBC PRESENT,BOTH PMN AND MONONUCLEAR NO ORGANISMS SEEN    CULT  09/10/2019    No growth aerobically or anaerobically. Performed at [facility], [place] [address] [place], [place] [address]      No results found for: ALBUMIN,  PREALBUMIN, LABURIC  No results found for: MG No results found for: VD25OH  No results found for: PREALBUMIN No flowsheet data found.   Body mass index is 33.23 kg/m.  Orders:  No orders of the defined types were placed in this encounter.  No orders of the defined types were placed in this encounter.    Procedures: No procedures performed  Clinical Data: No additional findings.  ROS:  All other systems negative, except as noted in the HPI. Review of Systems  Objective: Vital Signs: Ht 6' (1.829 m)   Wt 245 lb (111.1 kg)   BMI 33.23 kg/m   Specialty Comments:  No specialty comments available.  PMFS History: Patient Active Problem List   Diagnosis Date Noted  . Rupture of hamstring tendon, left, initial encounter   . Abscess of left thigh    No past medical history on file.  No family history on file.  Past Surgical History:  Procedure Laterality Date  . I & D EXTREMITY Left 09/10/2019   Procedure: LEFT THIGH DEBRIDEMENT;  Surgeon: Newt Minion, MD;  Location: [facility];  Service: Orthopedics;  Laterality: Left;  . NO PAST SURGERIES     Social History   Occupational History  . Not on file  Tobacco Use  . Smoking  status: Never Smoker  . Smokeless tobacco: Never Used  Substance and Sexual Activity  . Alcohol use: Yes    Alcohol/week: 10.0 standard drinks    Types: 10 Cans of beer per week  . Drug use: Not Currently  . Sexual activity: Not on file

## 2019-09-18 ENCOUNTER — Encounter: Payer: Self-pay | Admitting: Orthopedic Surgery

## 2019-10-06 ENCOUNTER — Encounter: Payer: Self-pay | Admitting: Orthopedic Surgery

## 2019-10-08 DIAGNOSIS — M25652 Stiffness of left hip, not elsewhere classified: Secondary | ICD-10-CM | POA: Diagnosis not present

## 2019-10-08 DIAGNOSIS — M25662 Stiffness of left knee, not elsewhere classified: Secondary | ICD-10-CM | POA: Diagnosis not present

## 2019-10-08 DIAGNOSIS — M62552 Muscle wasting and atrophy, not elsewhere classified, left thigh: Secondary | ICD-10-CM | POA: Diagnosis not present

## 2019-10-08 DIAGNOSIS — M79605 Pain in left leg: Secondary | ICD-10-CM | POA: Diagnosis not present

## 2019-10-13 ENCOUNTER — Telehealth: Payer: Self-pay | Admitting: Radiology

## 2019-10-13 NOTE — Telephone Encounter (Signed)
Dorene Sorrow, [facility], call requesting faxed orders for PT.  They would like post op note, do's and don'ts for post op protocol, and what exactly Dr. Lajoyce Corners would like for them to do faxed to [phone]

## 2019-10-14 ENCOUNTER — Telehealth: Payer: Self-pay

## 2019-10-14 DIAGNOSIS — M25662 Stiffness of left knee, not elsewhere classified: Secondary | ICD-10-CM | POA: Diagnosis not present

## 2019-10-14 DIAGNOSIS — M25652 Stiffness of left hip, not elsewhere classified: Secondary | ICD-10-CM | POA: Diagnosis not present

## 2019-10-14 DIAGNOSIS — M79605 Pain in left leg: Secondary | ICD-10-CM | POA: Diagnosis not present

## 2019-10-14 DIAGNOSIS — M62552 Muscle wasting and atrophy, not elsewhere classified, left thigh: Secondary | ICD-10-CM | POA: Diagnosis not present

## 2019-10-14 NOTE — Telephone Encounter (Signed)
Received a call this morning from office and advised per the last dictation what the pt was to be working on. Faxed the last office visit note to call with any questions.

## 2019-10-14 NOTE — Telephone Encounter (Signed)
Patient is to begin physical therapy and the office wanted the last office visit note with PT instructions faxed to [phone]. This has been sent for office visit today.

## 2019-10-16 DIAGNOSIS — M79605 Pain in left leg: Secondary | ICD-10-CM | POA: Diagnosis not present

## 2019-10-16 DIAGNOSIS — M25652 Stiffness of left hip, not elsewhere classified: Secondary | ICD-10-CM | POA: Diagnosis not present

## 2019-10-16 DIAGNOSIS — M25662 Stiffness of left knee, not elsewhere classified: Secondary | ICD-10-CM | POA: Diagnosis not present

## 2019-10-16 DIAGNOSIS — M62552 Muscle wasting and atrophy, not elsewhere classified, left thigh: Secondary | ICD-10-CM | POA: Diagnosis not present

## 2019-10-21 DIAGNOSIS — M62552 Muscle wasting and atrophy, not elsewhere classified, left thigh: Secondary | ICD-10-CM | POA: Diagnosis not present

## 2019-10-21 DIAGNOSIS — M79605 Pain in left leg: Secondary | ICD-10-CM | POA: Diagnosis not present

## 2019-10-21 DIAGNOSIS — M25652 Stiffness of left hip, not elsewhere classified: Secondary | ICD-10-CM | POA: Diagnosis not present

## 2019-10-21 DIAGNOSIS — M25662 Stiffness of left knee, not elsewhere classified: Secondary | ICD-10-CM | POA: Diagnosis not present

## 2019-10-23 DIAGNOSIS — M25652 Stiffness of left hip, not elsewhere classified: Secondary | ICD-10-CM | POA: Diagnosis not present

## 2019-10-23 DIAGNOSIS — M25662 Stiffness of left knee, not elsewhere classified: Secondary | ICD-10-CM | POA: Diagnosis not present

## 2019-10-23 DIAGNOSIS — M62552 Muscle wasting and atrophy, not elsewhere classified, left thigh: Secondary | ICD-10-CM | POA: Diagnosis not present

## 2019-10-23 DIAGNOSIS — M79605 Pain in left leg: Secondary | ICD-10-CM | POA: Diagnosis not present

## 2019-10-28 DIAGNOSIS — M79605 Pain in left leg: Secondary | ICD-10-CM | POA: Diagnosis not present

## 2019-10-28 DIAGNOSIS — M25652 Stiffness of left hip, not elsewhere classified: Secondary | ICD-10-CM | POA: Diagnosis not present

## 2019-10-28 DIAGNOSIS — M25662 Stiffness of left knee, not elsewhere classified: Secondary | ICD-10-CM | POA: Diagnosis not present

## 2019-10-28 DIAGNOSIS — M62552 Muscle wasting and atrophy, not elsewhere classified, left thigh: Secondary | ICD-10-CM | POA: Diagnosis not present

## 2019-10-30 DIAGNOSIS — M25662 Stiffness of left knee, not elsewhere classified: Secondary | ICD-10-CM | POA: Diagnosis not present

## 2019-10-30 DIAGNOSIS — M79605 Pain in left leg: Secondary | ICD-10-CM | POA: Diagnosis not present

## 2019-10-30 DIAGNOSIS — Z23 Encounter for immunization: Secondary | ICD-10-CM | POA: Diagnosis not present

## 2019-10-30 DIAGNOSIS — M62552 Muscle wasting and atrophy, not elsewhere classified, left thigh: Secondary | ICD-10-CM | POA: Diagnosis not present

## 2019-10-30 DIAGNOSIS — M25652 Stiffness of left hip, not elsewhere classified: Secondary | ICD-10-CM | POA: Diagnosis not present

## 2019-11-04 DIAGNOSIS — M62552 Muscle wasting and atrophy, not elsewhere classified, left thigh: Secondary | ICD-10-CM | POA: Diagnosis not present

## 2019-11-04 DIAGNOSIS — M25662 Stiffness of left knee, not elsewhere classified: Secondary | ICD-10-CM | POA: Diagnosis not present

## 2019-11-04 DIAGNOSIS — M79605 Pain in left leg: Secondary | ICD-10-CM | POA: Diagnosis not present

## 2019-11-04 DIAGNOSIS — M25652 Stiffness of left hip, not elsewhere classified: Secondary | ICD-10-CM | POA: Diagnosis not present

## 2019-11-10 DIAGNOSIS — M62552 Muscle wasting and atrophy, not elsewhere classified, left thigh: Secondary | ICD-10-CM | POA: Diagnosis not present

## 2019-11-10 DIAGNOSIS — M25662 Stiffness of left knee, not elsewhere classified: Secondary | ICD-10-CM | POA: Diagnosis not present

## 2019-11-10 DIAGNOSIS — M79605 Pain in left leg: Secondary | ICD-10-CM | POA: Diagnosis not present

## 2019-11-10 DIAGNOSIS — M25652 Stiffness of left hip, not elsewhere classified: Secondary | ICD-10-CM | POA: Diagnosis not present

## 2019-11-14 DIAGNOSIS — M25652 Stiffness of left hip, not elsewhere classified: Secondary | ICD-10-CM | POA: Diagnosis not present

## 2019-11-14 DIAGNOSIS — M62552 Muscle wasting and atrophy, not elsewhere classified, left thigh: Secondary | ICD-10-CM | POA: Diagnosis not present

## 2019-11-14 DIAGNOSIS — M25662 Stiffness of left knee, not elsewhere classified: Secondary | ICD-10-CM | POA: Diagnosis not present

## 2019-11-14 DIAGNOSIS — M79605 Pain in left leg: Secondary | ICD-10-CM | POA: Diagnosis not present

## 2019-11-18 DIAGNOSIS — M25662 Stiffness of left knee, not elsewhere classified: Secondary | ICD-10-CM | POA: Diagnosis not present

## 2019-11-18 DIAGNOSIS — M79605 Pain in left leg: Secondary | ICD-10-CM | POA: Diagnosis not present

## 2019-11-18 DIAGNOSIS — M25652 Stiffness of left hip, not elsewhere classified: Secondary | ICD-10-CM | POA: Diagnosis not present

## 2019-11-18 DIAGNOSIS — M62552 Muscle wasting and atrophy, not elsewhere classified, left thigh: Secondary | ICD-10-CM | POA: Diagnosis not present

## 2019-11-20 DIAGNOSIS — M79605 Pain in left leg: Secondary | ICD-10-CM | POA: Diagnosis not present

## 2019-11-20 DIAGNOSIS — M25662 Stiffness of left knee, not elsewhere classified: Secondary | ICD-10-CM | POA: Diagnosis not present

## 2019-11-20 DIAGNOSIS — M25652 Stiffness of left hip, not elsewhere classified: Secondary | ICD-10-CM | POA: Diagnosis not present

## 2019-11-20 DIAGNOSIS — M62552 Muscle wasting and atrophy, not elsewhere classified, left thigh: Secondary | ICD-10-CM | POA: Diagnosis not present

## 2019-11-20 DIAGNOSIS — Z23 Encounter for immunization: Secondary | ICD-10-CM | POA: Diagnosis not present

## 2019-11-25 DIAGNOSIS — M25652 Stiffness of left hip, not elsewhere classified: Secondary | ICD-10-CM | POA: Diagnosis not present

## 2019-11-25 DIAGNOSIS — M62552 Muscle wasting and atrophy, not elsewhere classified, left thigh: Secondary | ICD-10-CM | POA: Diagnosis not present

## 2019-11-25 DIAGNOSIS — M79605 Pain in left leg: Secondary | ICD-10-CM | POA: Diagnosis not present

## 2019-11-25 DIAGNOSIS — M25662 Stiffness of left knee, not elsewhere classified: Secondary | ICD-10-CM | POA: Diagnosis not present

## 2019-11-28 DIAGNOSIS — M25662 Stiffness of left knee, not elsewhere classified: Secondary | ICD-10-CM | POA: Diagnosis not present

## 2019-11-28 DIAGNOSIS — M25652 Stiffness of left hip, not elsewhere classified: Secondary | ICD-10-CM | POA: Diagnosis not present

## 2019-11-28 DIAGNOSIS — M79605 Pain in left leg: Secondary | ICD-10-CM | POA: Diagnosis not present

## 2019-11-28 DIAGNOSIS — M62552 Muscle wasting and atrophy, not elsewhere classified, left thigh: Secondary | ICD-10-CM | POA: Diagnosis not present

## 2019-12-02 DIAGNOSIS — M25652 Stiffness of left hip, not elsewhere classified: Secondary | ICD-10-CM | POA: Diagnosis not present

## 2019-12-02 DIAGNOSIS — M79605 Pain in left leg: Secondary | ICD-10-CM | POA: Diagnosis not present

## 2019-12-02 DIAGNOSIS — M62552 Muscle wasting and atrophy, not elsewhere classified, left thigh: Secondary | ICD-10-CM | POA: Diagnosis not present

## 2019-12-02 DIAGNOSIS — M25662 Stiffness of left knee, not elsewhere classified: Secondary | ICD-10-CM | POA: Diagnosis not present

## 2019-12-04 DIAGNOSIS — M79605 Pain in left leg: Secondary | ICD-10-CM | POA: Diagnosis not present

## 2019-12-04 DIAGNOSIS — M25662 Stiffness of left knee, not elsewhere classified: Secondary | ICD-10-CM | POA: Diagnosis not present

## 2019-12-04 DIAGNOSIS — M62552 Muscle wasting and atrophy, not elsewhere classified, left thigh: Secondary | ICD-10-CM | POA: Diagnosis not present

## 2019-12-04 DIAGNOSIS — M25652 Stiffness of left hip, not elsewhere classified: Secondary | ICD-10-CM | POA: Diagnosis not present

## 2019-12-09 DIAGNOSIS — M79605 Pain in left leg: Secondary | ICD-10-CM | POA: Diagnosis not present

## 2019-12-09 DIAGNOSIS — M25662 Stiffness of left knee, not elsewhere classified: Secondary | ICD-10-CM | POA: Diagnosis not present

## 2019-12-09 DIAGNOSIS — M62552 Muscle wasting and atrophy, not elsewhere classified, left thigh: Secondary | ICD-10-CM | POA: Diagnosis not present

## 2019-12-09 DIAGNOSIS — M25652 Stiffness of left hip, not elsewhere classified: Secondary | ICD-10-CM | POA: Diagnosis not present

## 2019-12-11 DIAGNOSIS — M25662 Stiffness of left knee, not elsewhere classified: Secondary | ICD-10-CM | POA: Diagnosis not present

## 2019-12-11 DIAGNOSIS — M62552 Muscle wasting and atrophy, not elsewhere classified, left thigh: Secondary | ICD-10-CM | POA: Diagnosis not present

## 2019-12-11 DIAGNOSIS — M25652 Stiffness of left hip, not elsewhere classified: Secondary | ICD-10-CM | POA: Diagnosis not present

## 2019-12-11 DIAGNOSIS — M79605 Pain in left leg: Secondary | ICD-10-CM | POA: Diagnosis not present

## 2019-12-16 DIAGNOSIS — M62552 Muscle wasting and atrophy, not elsewhere classified, left thigh: Secondary | ICD-10-CM | POA: Diagnosis not present

## 2019-12-16 DIAGNOSIS — M79605 Pain in left leg: Secondary | ICD-10-CM | POA: Diagnosis not present

## 2019-12-16 DIAGNOSIS — M25652 Stiffness of left hip, not elsewhere classified: Secondary | ICD-10-CM | POA: Diagnosis not present

## 2019-12-16 DIAGNOSIS — M25662 Stiffness of left knee, not elsewhere classified: Secondary | ICD-10-CM | POA: Diagnosis not present

## 2019-12-31 DIAGNOSIS — M62552 Muscle wasting and atrophy, not elsewhere classified, left thigh: Secondary | ICD-10-CM | POA: Diagnosis not present

## 2019-12-31 DIAGNOSIS — M25662 Stiffness of left knee, not elsewhere classified: Secondary | ICD-10-CM | POA: Diagnosis not present

## 2019-12-31 DIAGNOSIS — M79605 Pain in left leg: Secondary | ICD-10-CM | POA: Diagnosis not present

## 2019-12-31 DIAGNOSIS — M25652 Stiffness of left hip, not elsewhere classified: Secondary | ICD-10-CM | POA: Diagnosis not present

## 2020-01-02 DIAGNOSIS — M25662 Stiffness of left knee, not elsewhere classified: Secondary | ICD-10-CM | POA: Diagnosis not present

## 2020-01-02 DIAGNOSIS — M25652 Stiffness of left hip, not elsewhere classified: Secondary | ICD-10-CM | POA: Diagnosis not present

## 2020-01-02 DIAGNOSIS — M79605 Pain in left leg: Secondary | ICD-10-CM | POA: Diagnosis not present

## 2020-01-02 DIAGNOSIS — M62552 Muscle wasting and atrophy, not elsewhere classified, left thigh: Secondary | ICD-10-CM | POA: Diagnosis not present

## 2020-01-06 DIAGNOSIS — M62552 Muscle wasting and atrophy, not elsewhere classified, left thigh: Secondary | ICD-10-CM | POA: Diagnosis not present

## 2020-01-06 DIAGNOSIS — M79605 Pain in left leg: Secondary | ICD-10-CM | POA: Diagnosis not present

## 2020-01-06 DIAGNOSIS — M25662 Stiffness of left knee, not elsewhere classified: Secondary | ICD-10-CM | POA: Diagnosis not present

## 2020-01-06 DIAGNOSIS — M25652 Stiffness of left hip, not elsewhere classified: Secondary | ICD-10-CM | POA: Diagnosis not present

## 2020-01-08 DIAGNOSIS — M25662 Stiffness of left knee, not elsewhere classified: Secondary | ICD-10-CM | POA: Diagnosis not present

## 2020-01-08 DIAGNOSIS — M79605 Pain in left leg: Secondary | ICD-10-CM | POA: Diagnosis not present

## 2020-01-08 DIAGNOSIS — M25652 Stiffness of left hip, not elsewhere classified: Secondary | ICD-10-CM | POA: Diagnosis not present

## 2020-01-08 DIAGNOSIS — M62552 Muscle wasting and atrophy, not elsewhere classified, left thigh: Secondary | ICD-10-CM | POA: Diagnosis not present

## 2020-02-12 DIAGNOSIS — M62552 Muscle wasting and atrophy, not elsewhere classified, left thigh: Secondary | ICD-10-CM | POA: Diagnosis not present

## 2020-02-12 DIAGNOSIS — M25662 Stiffness of left knee, not elsewhere classified: Secondary | ICD-10-CM | POA: Diagnosis not present

## 2020-02-12 DIAGNOSIS — M25652 Stiffness of left hip, not elsewhere classified: Secondary | ICD-10-CM | POA: Diagnosis not present

## 2020-02-12 DIAGNOSIS — M79605 Pain in left leg: Secondary | ICD-10-CM | POA: Diagnosis not present

## 2020-04-16 DIAGNOSIS — Z03818 Encounter for observation for suspected exposure to other biological agents ruled out: Secondary | ICD-10-CM | POA: Diagnosis not present

## 2020-07-22 DIAGNOSIS — M542 Cervicalgia: Secondary | ICD-10-CM | POA: Diagnosis not present

## 2020-07-22 DIAGNOSIS — R293 Abnormal posture: Secondary | ICD-10-CM | POA: Diagnosis not present

## 2020-07-22 DIAGNOSIS — M546 Pain in thoracic spine: Secondary | ICD-10-CM | POA: Diagnosis not present

## 2020-07-22 DIAGNOSIS — M25611 Stiffness of right shoulder, not elsewhere classified: Secondary | ICD-10-CM | POA: Diagnosis not present

## 2020-07-27 DIAGNOSIS — R293 Abnormal posture: Secondary | ICD-10-CM | POA: Diagnosis not present

## 2020-07-27 DIAGNOSIS — M542 Cervicalgia: Secondary | ICD-10-CM | POA: Diagnosis not present

## 2020-07-27 DIAGNOSIS — M25611 Stiffness of right shoulder, not elsewhere classified: Secondary | ICD-10-CM | POA: Diagnosis not present

## 2020-07-27 DIAGNOSIS — M546 Pain in thoracic spine: Secondary | ICD-10-CM | POA: Diagnosis not present

## 2020-07-29 DIAGNOSIS — M542 Cervicalgia: Secondary | ICD-10-CM | POA: Diagnosis not present

## 2020-07-29 DIAGNOSIS — M546 Pain in thoracic spine: Secondary | ICD-10-CM | POA: Diagnosis not present

## 2020-07-29 DIAGNOSIS — M25611 Stiffness of right shoulder, not elsewhere classified: Secondary | ICD-10-CM | POA: Diagnosis not present

## 2020-07-29 DIAGNOSIS — R293 Abnormal posture: Secondary | ICD-10-CM | POA: Diagnosis not present

## 2020-08-04 DIAGNOSIS — R293 Abnormal posture: Secondary | ICD-10-CM | POA: Diagnosis not present

## 2020-08-04 DIAGNOSIS — M542 Cervicalgia: Secondary | ICD-10-CM | POA: Diagnosis not present

## 2020-08-04 DIAGNOSIS — M25611 Stiffness of right shoulder, not elsewhere classified: Secondary | ICD-10-CM | POA: Diagnosis not present

## 2020-08-04 DIAGNOSIS — Z23 Encounter for immunization: Secondary | ICD-10-CM | POA: Diagnosis not present

## 2020-08-04 DIAGNOSIS — M546 Pain in thoracic spine: Secondary | ICD-10-CM | POA: Diagnosis not present

## 2020-08-06 DIAGNOSIS — M546 Pain in thoracic spine: Secondary | ICD-10-CM | POA: Diagnosis not present

## 2020-08-06 DIAGNOSIS — M542 Cervicalgia: Secondary | ICD-10-CM | POA: Diagnosis not present

## 2020-08-06 DIAGNOSIS — M25611 Stiffness of right shoulder, not elsewhere classified: Secondary | ICD-10-CM | POA: Diagnosis not present

## 2020-08-06 DIAGNOSIS — R293 Abnormal posture: Secondary | ICD-10-CM | POA: Diagnosis not present

## 2020-08-16 DIAGNOSIS — Z1159 Encounter for screening for other viral diseases: Secondary | ICD-10-CM | POA: Diagnosis not present

## 2020-08-18 DIAGNOSIS — R293 Abnormal posture: Secondary | ICD-10-CM | POA: Diagnosis not present

## 2020-08-18 DIAGNOSIS — M542 Cervicalgia: Secondary | ICD-10-CM | POA: Diagnosis not present

## 2020-08-18 DIAGNOSIS — M546 Pain in thoracic spine: Secondary | ICD-10-CM | POA: Diagnosis not present

## 2020-08-18 DIAGNOSIS — M25611 Stiffness of right shoulder, not elsewhere classified: Secondary | ICD-10-CM | POA: Diagnosis not present

## 2021-05-25 DIAGNOSIS — G4700 Insomnia, unspecified: Secondary | ICD-10-CM | POA: Diagnosis not present

## 2021-05-25 DIAGNOSIS — F909 Attention-deficit hyperactivity disorder, unspecified type: Secondary | ICD-10-CM | POA: Diagnosis not present

## 2021-05-25 DIAGNOSIS — Z0000 Encounter for general adult medical examination without abnormal findings: Secondary | ICD-10-CM | POA: Diagnosis not present

## 2021-06-08 DIAGNOSIS — R635 Abnormal weight gain: Secondary | ICD-10-CM | POA: Diagnosis not present

## 2021-06-08 DIAGNOSIS — Z1331 Encounter for screening for depression: Secondary | ICD-10-CM | POA: Diagnosis not present

## 2021-06-08 DIAGNOSIS — Z23 Encounter for immunization: Secondary | ICD-10-CM | POA: Diagnosis not present

## 2021-06-08 DIAGNOSIS — Z1389 Encounter for screening for other disorder: Secondary | ICD-10-CM | POA: Diagnosis not present

## 2021-06-08 DIAGNOSIS — Z0000 Encounter for general adult medical examination without abnormal findings: Secondary | ICD-10-CM | POA: Diagnosis not present

## 2021-06-22 DIAGNOSIS — M546 Pain in thoracic spine: Secondary | ICD-10-CM | POA: Diagnosis not present

## 2021-06-22 DIAGNOSIS — M542 Cervicalgia: Secondary | ICD-10-CM | POA: Diagnosis not present

## 2021-06-22 DIAGNOSIS — M25611 Stiffness of right shoulder, not elsewhere classified: Secondary | ICD-10-CM | POA: Diagnosis not present

## 2021-06-22 DIAGNOSIS — R293 Abnormal posture: Secondary | ICD-10-CM | POA: Diagnosis not present

## 2021-06-27 DIAGNOSIS — M542 Cervicalgia: Secondary | ICD-10-CM | POA: Diagnosis not present

## 2021-06-27 DIAGNOSIS — M25611 Stiffness of right shoulder, not elsewhere classified: Secondary | ICD-10-CM | POA: Diagnosis not present

## 2021-06-27 DIAGNOSIS — M546 Pain in thoracic spine: Secondary | ICD-10-CM | POA: Diagnosis not present

## 2021-06-27 DIAGNOSIS — R293 Abnormal posture: Secondary | ICD-10-CM | POA: Diagnosis not present

## 2021-07-04 DIAGNOSIS — M546 Pain in thoracic spine: Secondary | ICD-10-CM | POA: Diagnosis not present

## 2021-07-04 DIAGNOSIS — M542 Cervicalgia: Secondary | ICD-10-CM | POA: Diagnosis not present

## 2021-07-04 DIAGNOSIS — M25611 Stiffness of right shoulder, not elsewhere classified: Secondary | ICD-10-CM | POA: Diagnosis not present

## 2021-07-04 DIAGNOSIS — R293 Abnormal posture: Secondary | ICD-10-CM | POA: Diagnosis not present

## 2021-07-11 DIAGNOSIS — M542 Cervicalgia: Secondary | ICD-10-CM | POA: Diagnosis not present

## 2021-07-11 DIAGNOSIS — M25611 Stiffness of right shoulder, not elsewhere classified: Secondary | ICD-10-CM | POA: Diagnosis not present

## 2021-07-11 DIAGNOSIS — M546 Pain in thoracic spine: Secondary | ICD-10-CM | POA: Diagnosis not present

## 2021-07-11 DIAGNOSIS — R293 Abnormal posture: Secondary | ICD-10-CM | POA: Diagnosis not present

## 2021-07-29 DIAGNOSIS — M542 Cervicalgia: Secondary | ICD-10-CM | POA: Diagnosis not present

## 2021-07-29 DIAGNOSIS — R293 Abnormal posture: Secondary | ICD-10-CM | POA: Diagnosis not present

## 2021-07-29 DIAGNOSIS — M546 Pain in thoracic spine: Secondary | ICD-10-CM | POA: Diagnosis not present

## 2021-07-29 DIAGNOSIS — M25611 Stiffness of right shoulder, not elsewhere classified: Secondary | ICD-10-CM | POA: Diagnosis not present

## 2021-08-03 DIAGNOSIS — M25611 Stiffness of right shoulder, not elsewhere classified: Secondary | ICD-10-CM | POA: Diagnosis not present

## 2021-08-03 DIAGNOSIS — M546 Pain in thoracic spine: Secondary | ICD-10-CM | POA: Diagnosis not present

## 2021-08-03 DIAGNOSIS — M542 Cervicalgia: Secondary | ICD-10-CM | POA: Diagnosis not present

## 2021-08-03 DIAGNOSIS — R293 Abnormal posture: Secondary | ICD-10-CM | POA: Diagnosis not present

## 2021-08-09 DIAGNOSIS — M546 Pain in thoracic spine: Secondary | ICD-10-CM | POA: Diagnosis not present

## 2021-08-09 DIAGNOSIS — R293 Abnormal posture: Secondary | ICD-10-CM | POA: Diagnosis not present

## 2021-08-09 DIAGNOSIS — M542 Cervicalgia: Secondary | ICD-10-CM | POA: Diagnosis not present

## 2021-08-09 DIAGNOSIS — M25611 Stiffness of right shoulder, not elsewhere classified: Secondary | ICD-10-CM | POA: Diagnosis not present

## 2023-04-24 DIAGNOSIS — D225 Melanocytic nevi of trunk: Secondary | ICD-10-CM | POA: Diagnosis not present

## 2023-04-24 DIAGNOSIS — L821 Other seborrheic keratosis: Secondary | ICD-10-CM | POA: Diagnosis not present

## 2023-11-29 DIAGNOSIS — Z8249 Family history of ischemic heart disease and other diseases of the circulatory system: Secondary | ICD-10-CM | POA: Diagnosis not present

## 2023-11-29 DIAGNOSIS — Z0000 Encounter for general adult medical examination without abnormal findings: Secondary | ICD-10-CM | POA: Diagnosis not present

## 2023-11-29 DIAGNOSIS — R8281 Pyuria: Secondary | ICD-10-CM | POA: Diagnosis not present

## 2023-11-29 DIAGNOSIS — Z79899 Other long term (current) drug therapy: Secondary | ICD-10-CM | POA: Diagnosis not present

## 2023-11-29 DIAGNOSIS — Z5181 Encounter for therapeutic drug level monitoring: Secondary | ICD-10-CM | POA: Diagnosis not present

## 2023-12-04 DIAGNOSIS — J309 Allergic rhinitis, unspecified: Secondary | ICD-10-CM | POA: Diagnosis not present

## 2023-12-04 DIAGNOSIS — Z0000 Encounter for general adult medical examination without abnormal findings: Secondary | ICD-10-CM | POA: Diagnosis not present

## 2024-05-22 DIAGNOSIS — L820 Inflamed seborrheic keratosis: Secondary | ICD-10-CM | POA: Diagnosis not present

## 2024-05-22 DIAGNOSIS — D225 Melanocytic nevi of trunk: Secondary | ICD-10-CM | POA: Diagnosis not present

## 2024-05-22 DIAGNOSIS — D2261 Melanocytic nevi of right upper limb, including shoulder: Secondary | ICD-10-CM | POA: Diagnosis not present
